# Patient Record
Sex: Male | Born: 1995 | Race: White | Hispanic: No | Marital: Single | Smoking: Never smoker
Health system: Southern US, Community
[De-identification: ages and names within clinical notes are randomized; demographics above are authoritative.]

## PROBLEM LIST (undated history)

## (undated) DIAGNOSIS — G40909 Epilepsy, unspecified, not intractable, without status epilepticus: Secondary | ICD-10-CM

## (undated) DIAGNOSIS — K5190 Ulcerative colitis, unspecified, without complications: Secondary | ICD-10-CM

## (undated) DIAGNOSIS — M419 Scoliosis, unspecified: Secondary | ICD-10-CM

---

## 2018-05-12 ENCOUNTER — Other Ambulatory Visit: Payer: Self-pay

## 2018-05-12 ENCOUNTER — Encounter: Payer: Self-pay | Admitting: Emergency Medicine

## 2018-05-12 DIAGNOSIS — K5190 Ulcerative colitis, unspecified, without complications: Secondary | ICD-10-CM | POA: Insufficient documentation

## 2018-05-12 DIAGNOSIS — R1013 Epigastric pain: Secondary | ICD-10-CM | POA: Diagnosis present

## 2018-05-12 LAB — CBC
HCT: 45.7 % (ref 39.0–52.0)
Hemoglobin: 15.9 g/dL (ref 13.0–17.0)
MCH: 30.1 pg (ref 26.0–34.0)
MCHC: 34.8 g/dL (ref 30.0–36.0)
MCV: 86.6 fL (ref 80.0–100.0)
Platelets: 260 10*3/uL (ref 150–400)
RBC: 5.28 MIL/uL (ref 4.22–5.81)
RDW: 12.2 % (ref 11.5–15.5)
WBC: 7.7 10*3/uL (ref 4.0–10.5)
nRBC: 0 % (ref 0.0–0.2)

## 2018-05-12 LAB — COMPREHENSIVE METABOLIC PANEL
ALT: 29 U/L (ref 0–44)
AST: 31 U/L (ref 15–41)
Albumin: 5.2 g/dL — ABNORMAL HIGH (ref 3.5–5.0)
Alkaline Phosphatase: 47 U/L (ref 38–126)
Anion gap: 7 (ref 5–15)
BUN: 21 mg/dL — ABNORMAL HIGH (ref 6–20)
CHLORIDE: 105 mmol/L (ref 98–111)
CO2: 28 mmol/L (ref 22–32)
Calcium: 9.3 mg/dL (ref 8.9–10.3)
Creatinine, Ser: 0.86 mg/dL (ref 0.61–1.24)
GFR calc Af Amer: >60 mL/min (ref >60)
Glucose, Bld: 94 mg/dL (ref 70–99)
Potassium: 3.7 mmol/L (ref 3.5–5.1)
Sodium: 140 mmol/L (ref 135–145)
Total Bilirubin: 0.7 mg/dL (ref 0.3–1.2)
Total Protein: 7.5 g/dL (ref 6.5–8.1)

## 2018-05-12 LAB — URINALYSIS, COMPLETE (UACMP) WITH MICROSCOPIC
Bacteria, UA: NONE SEEN
Bilirubin Urine: NEGATIVE
Glucose, UA: NEGATIVE mg/dL
Hgb urine dipstick: NEGATIVE
Ketones, ur: NEGATIVE mg/dL
Leukocytes, UA: NEGATIVE
Nitrite: NEGATIVE
PH: 7 (ref 5.0–8.0)
Protein, ur: NEGATIVE mg/dL
Specific Gravity, Urine: 1.019 (ref 1.005–1.030)
Squamous Epithelial / HPF: NONE SEEN (ref 0–5)

## 2018-05-12 LAB — LIPASE, BLOOD: LIPASE: 35 U/L (ref 11–51)

## 2018-05-12 NOTE — ED Triage Notes (Signed)
Pt arrives ambulatory to triage with c/o upper abdominal pain. X 2 days. Pt reports that he was given Meloxicam 7.5 mg for back pain a few days ago and that abdominal pain was listed as one of the side effects. Pt is in NAD.

## 2018-05-13 ENCOUNTER — Emergency Department: Payer: 59

## 2018-05-13 ENCOUNTER — Emergency Department
Admission: EM | Admit: 2018-05-13 | Discharge: 2018-05-13 | Disposition: A | Discharge status: Home / Self Care | Payer: 59 | Attending: Emergency Medicine | Admitting: Emergency Medicine

## 2018-05-13 DIAGNOSIS — R1013 Epigastric pain: Secondary | ICD-10-CM

## 2018-05-13 DIAGNOSIS — K5190 Ulcerative colitis, unspecified, without complications: Secondary | ICD-10-CM

## 2018-05-13 HISTORY — DX: Ulcerative colitis, unspecified, without complications: K51.90

## 2018-05-13 HISTORY — DX: Epilepsy, unspecified, not intractable, without status epilepticus: G40.909

## 2018-05-13 HISTORY — DX: Scoliosis, unspecified: M41.9

## 2018-05-13 MED ORDER — HYDROCODONE-ACETAMINOPHEN 5-325 MG PO TABS
2.0000 | ORAL_TABLET | Freq: Once | ORAL | Status: AC
  Administered 2018-05-13: 2 via ORAL

## 2018-05-13 MED ORDER — HYDROCODONE-ACETAMINOPHEN 5-325 MG PO TABS
ORAL_TABLET | ORAL | Status: AC
  Filled 2018-05-13: qty 1

## 2018-05-13 MED ORDER — ALUM & MAG HYDROXIDE-SIMETH 200-200-20 MG/5ML PO SUSP
30.0000 mL | Freq: Once | ORAL | Status: AC
  Administered 2018-05-13: 30 mL via ORAL
  Filled 2018-05-13: qty 30

## 2018-05-13 MED ORDER — FAMOTIDINE 20 MG PO TABS: 20.0000 mg | ORAL_TABLET | Freq: Two times a day (BID) | ORAL | 0 refills | Status: AC

## 2018-05-13 MED ORDER — HYDROCODONE-ACETAMINOPHEN 5-325 MG PO TABS: 1.0000 | ORAL_TABLET | Freq: Four times a day (QID) | ORAL | 0 refills | Status: AC | PRN

## 2018-05-13 MED ORDER — SUCRALFATE 1 G PO TABS
1.0000 g | ORAL_TABLET | Freq: Once | ORAL | Status: AC
  Administered 2018-05-13: 1 g via ORAL
  Filled 2018-05-13: qty 1

## 2018-05-13 MED ORDER — MISOPROSTOL 200 MCG PO TABS
200.0000 ug | ORAL_TABLET | Freq: Once | ORAL | Status: AC
  Administered 2018-05-13: 200 ug via ORAL
  Filled 2018-05-13: qty 1

## 2018-05-13 NOTE — ED Notes (Signed)
Family to stat desk asking about wait time. Family given update and family verbalizes understanding.

## 2018-05-13 NOTE — Discharge Instructions (Addendum)
Fortunately today your blood work and your ultrasound were very reassuring.  Please begin taking your antacids twice a day as prescribed and make and appointment to follow up with the gastroenterologist this week for a recheck.  Return to the ED sooner for any concerns.  It was a pleasure to take care of you today, and thank you for coming to our emergency department.  If you have any questions or concerns before leaving please ask the nurse to grab me and I'm more than happy to go through your aftercare instructions again.  If you were prescribed any opioid pain medication today such as Norco, Vicodin, Percocet, morphine, hydrocodone, or oxycodone please make sure you do not drive when you are taking this medication as it can alter your ability to drive safely.  If you have any concerns once you are home that you are not improving or are in fact getting worse before you can make it to your follow-up appointment, please do not hesitate to call 911 and come back for further evaluation.  Darel Hong, MD  Results for orders placed or performed during the hospital encounter of 05/13/18  Lipase, blood  Result Value Ref Range   Lipase 35 11 - 51 U/L  Comprehensive metabolic panel  Result Value Ref Range   Sodium 140 135 - 145 mmol/L   Potassium 3.7 3.5 - 5.1 mmol/L   Chloride 105 98 - 111 mmol/L   CO2 28 22 - 32 mmol/L   Glucose, Bld 94 70 - 99 mg/dL   BUN 21 (H) 6 - 20 mg/dL   Creatinine, Ser 0.86 0.61 - 1.24 mg/dL   Calcium 9.3 8.9 - 10.3 mg/dL   Total Protein 7.5 6.5 - 8.1 g/dL   Albumin 5.2 (H) 3.5 - 5.0 g/dL   AST 31 15 - 41 U/L   ALT 29 0 - 44 U/L   Alkaline Phosphatase 47 38 - 126 U/L   Total Bilirubin 0.7 0.3 - 1.2 mg/dL   GFR calc non Af Amer >60 >60 mL/min   GFR calc Af Amer >60 >60 mL/min   Anion gap 7 5 - 15  CBC  Result Value Ref Range   WBC 7.7 4.0 - 10.5 K/uL   RBC 5.28 4.22 - 5.81 MIL/uL   Hemoglobin 15.9 13.0 - 17.0 g/dL   HCT 45.7 39.0 - 52.0 %   MCV 86.6 80.0 -  100.0 fL   MCH 30.1 26.0 - 34.0 pg   MCHC 34.8 30.0 - 36.0 g/dL   RDW 12.2 11.5 - 15.5 %   Platelets 260 150 - 400 K/uL   nRBC 0.0 0.0 - 0.2 %  Urinalysis, Complete w Microscopic  Result Value Ref Range   Color, Urine YELLOW (A) YELLOW   APPearance CLEAR (A) CLEAR   Specific Gravity, Urine 1.019 1.005 - 1.030   pH 7.0 5.0 - 8.0   Glucose, UA NEGATIVE NEGATIVE mg/dL   Hgb urine dipstick NEGATIVE NEGATIVE   Bilirubin Urine NEGATIVE NEGATIVE   Ketones, ur NEGATIVE NEGATIVE mg/dL   Protein, ur NEGATIVE NEGATIVE mg/dL   Nitrite NEGATIVE NEGATIVE   Leukocytes, UA NEGATIVE NEGATIVE   RBC / HPF 0-5 0 - 5 RBC/hpf   WBC, UA 0-5 0 - 5 WBC/hpf   Bacteria, UA NONE SEEN NONE SEEN   Squamous Epithelial / LPF NONE SEEN 0 - 5   US Abdomen Limited Ruq  Result Date: 05/13/2018 CLINICAL DATA:  Initial evaluation for acute epigastric pain. EXAM: ULTRASOUND ABDOMEN LIMITED  RIGHT UPPER QUADRANT COMPARISON:  None. FINDINGS: Gallbladder: No gallstones or wall thickening visualized. No sonographic Murphy sign noted by sonographer. Common bile duct: Diameter: 2 mm Liver: No focal lesion identified. Within normal limits in parenchymal echogenicity. Portal vein is patent on color Doppler imaging with normal direction of blood flow towards the liver. IMPRESSION: Normal right upper quadrant ultrasound. Electronically Signed   By: Jeannine Boga M.D.   On: 05/13/2018 02:44

## 2018-05-13 NOTE — ED Provider Notes (Signed)
[facility] Emergency Department Provider Note  ____________________________________________   First MD Initiated Contact with Patient 05/13/18 [phone]     (approximate)  I have reviewed the triage vital signs and the nursing notes.   HISTORY  Chief Complaint Abdominal Pain   HPI Aaron Rogers is a 22 y.o. male who self presents to the emergency department with roughly 2 days of epigastric pain.  The pain is epigastric burning and constant.  Not postprandial.  Some nausea.  No fevers or chills.  No diarrhea.  He recently began taking meloxicam twice a day for "arthritis" which concerned him because he said he looked up that it can cause issues with your stomach.  He has no history of abdominal surgeries.  He does have a remote history of ulcerative colitis although has not taken any medications in more than 2 years nor has he had any follow-up.  Denies fevers or chills.  Symptoms came on insidiously were moderate to severe and nothing seems to make them better or worse.  They are non-positional.    Past Medical History:  Diagnosis Date  . Epilepsy ([place])   . Scoliosis   . Ulcerative colitis ([place])     There are no active problems to display for this patient.   History reviewed. No pertinent surgical history.  Prior to Admission medications   Medication Sig Start Date End Date Taking? Authorizing Provider  famotidine (PEPCID) 20 MG tablet Take 1 tablet (20 mg total) by mouth 2 (two) times daily. 05/13/18 05/13/19  Darel Hong, MD  HYDROcodone-acetaminophen (NORCO) 5-325 MG tablet Take 1 tablet by mouth every 6 (six) hours as needed for up to 7 doses for severe pain. 05/13/18   Darel Hong, MD    Allergies Patient has no known allergies.  No family history on file.  Social History Social History   Tobacco Use  . Smoking status: Never Smoker  . Smokeless tobacco: Never Used  Substance Use Topics  . Alcohol use: Yes    Comment: Rarely  . Drug  use: Never    Review of Systems Constitutional: No fever/chills Eyes: No visual changes. ENT: No sore throat. Cardiovascular: Denies chest pain. Respiratory: Denies shortness of breath. Gastrointestinal: Positive for abdominal pain.  Positive for nausea, no vomiting.  No diarrhea.  No constipation. Genitourinary: Negative for dysuria. Musculoskeletal: Negative for back pain. Skin: Negative for rash. Neurological: Negative for headaches, focal weakness or numbness.   ____________________________________________   PHYSICAL EXAM:  VITAL SIGNS: ED Triage Vitals  Enc Vitals Group     BP 05/12/18 2330 138/82     Pulse Rate 05/12/18 2330 79     Resp 05/12/18 2330 18     Temp 05/12/18 2330 98.5 F (36.9 C)     Temp Source 05/12/18 2330 Oral     SpO2 05/12/18 2330 99 %     Weight 05/12/18 2331 145 lb (65.8 kg)     Height 05/12/18 2331 5' 6"  (1.676 m)     Head Circumference --      Peak Flow --      Pain Score 05/12/18 2330 7     Pain Loc --      Pain Edu? --      Excl. in [place]? --     Constitutional: Alert and oriented x4 somewhat anxious appearing but nontoxic no diaphoresis speaks full clear sentences Eyes: PERRL EOMI. Head: Atraumatic. Nose: No congestion/rhinnorhea. Mouth/Throat: No trismus Neck: No stridor.   Cardiovascular: Normal rate, regular  rhythm. Grossly normal heart sounds.  Good peripheral circulation. Respiratory: Normal respiratory effort.  No retractions. Lungs CTAB and moving good air Gastrointestinal: Soft abdomen mild upper abdominal tenderness with no rebound or guarding no peritonitis negative Murphy's Musculoskeletal: No lower extremity edema   Neurologic:  Normal speech and language. No gross focal neurologic deficits are appreciated. Skin:  Skin is warm, dry and intact. No rash noted. Psychiatric: Mood and affect are normal. Speech and behavior are normal.    ____________________________________________   DIFFERENTIAL includes but not limited  to  Biliary colic, cholecystitis, GI bleed, ulcerative colitis ____________________________________________   LABS (all labs ordered are listed, but only abnormal results are displayed)  Labs Reviewed  COMPREHENSIVE METABOLIC PANEL - Abnormal; Notable for the following components:      Result Value   BUN 21 (*)    Albumin 5.2 (*)    All other components within normal limits  URINALYSIS, COMPLETE (UACMP) WITH MICROSCOPIC - Abnormal; Notable for the following components:   Color, Urine YELLOW (*)    APPearance CLEAR (*)    All other components within normal limits  LIPASE, BLOOD  CBC    Lab work reviewed by me with no acute disease noted __________________________________________  EKG  ED ECG REPORT I, Darel Hong, the attending physician, personally viewed and interpreted this ECG.  Date: 05/12/2018 EKG Time:  Rate: 79 Rhythm: normal sinus rhythm QRS Axis: Rightward axis Intervals: normal ST/T Wave abnormalities: normal Narrative Interpretation: no evidence of acute ischemia  ____________________________________________  RADIOLOGY  Upper quadrant ultrasound reviewed by me with no acute disease ____________________________________________   PROCEDURES  Procedure(s) performed: o  Procedures  Critical Care performed: no  ____________________________________________   INITIAL IMPRESSION / ASSESSMENT AND PLAN / ED COURSE  Pertinent labs & imaging results that were available during my care of the patient were reviewed by me and considered in my medical decision making (see chart for details).   As part of my medical decision making, I reviewed the following data within the [place] History obtained from family if available, nursing notes, old chart and ekg, as well as notes from prior ED visits.  She comes to the emergency department with several days of epigastric pain.  Lab work is reassuring but epigastric pain very well could  represent biliary disease so ultrasound is pending.  I will also treat him with Maalox, Cytotec, and Carafate in the meantime.  I appreciate his S1Q3T3 pattern however he has no chest pain or shortness of breath and no suggestion of pulmonary embolism.  This is likely a normal variant in a young man.  The patient's pain is only minimally improved after the GI cocktail.  Ultrasound is fortunately reassuring.  We explained the diagnostic uncertainty to the patient and the importance of reestablishing care with GI as it is very important that he resume treatment.  Strict return precautions have been given.      ____________________________________________   FINAL CLINICAL IMPRESSION(S) / ED DIAGNOSES  Final diagnoses:  Epigastric pain  Ulcerative colitis without complications, unspecified location [facility])      NEW MEDICATIONS STARTED DURING THIS VISIT:  Discharge Medication List as of 05/13/2018  2:56 AM    START taking these medications   Details  famotidine (PEPCID) 20 MG tablet Take 1 tablet (20 mg total) by mouth 2 (two) times daily., Starting Sun 05/13/2018, Until Mon 05/13/2019, Print         Note:  This document was prepared using Dragon voice  recognition software and may include unintentional dictation errors.     Darel Hong, MD 05/16/18 [phone]

## 2018-05-13 NOTE — ED Notes (Signed)
Pt states that pain started yesterday and gotten worse today. Mid abdominal pain. Nausea with no vomiting. Pt alert and oriented x 4. Family at bedside.

## 2018-06-11 ENCOUNTER — Other Ambulatory Visit
Admission: RE | Admit: 2018-06-11 | Discharge: 2018-06-11 | Disposition: A | Discharge status: Home / Self Care | Payer: 59 | Source: Ambulatory Visit | Attending: Gastroenterology | Admitting: Gastroenterology

## 2018-06-11 DIAGNOSIS — K51919 Ulcerative colitis, unspecified with unspecified complications: Secondary | ICD-10-CM | POA: Insufficient documentation

## 2018-06-11 LAB — C DIFFICILE QUICK SCREEN W PCR REFLEX
C DIFFICILE (CDIFF) INTERP: NOT DETECTED
C Diff antigen: NEGATIVE
C Diff toxin: NEGATIVE

## 2018-06-14 LAB — CALPROTECTIN, FECAL: Calprotectin, Fecal: 82 ug/g (ref 0–120)

## 2020-07-27 IMAGING — US US ABDOMEN LIMITED
1 series · 14 of 25 positions shown · non-contrast
Comparison: None.

CLINICAL DATA: Initial evaluation for acute epigastric pain.

EXAM:
ULTRASOUND ABDOMEN LIMITED RIGHT UPPER QUADRANT

[Series 1: us abdomen limited · 14 of 60 slices shown]
[im 1/60]
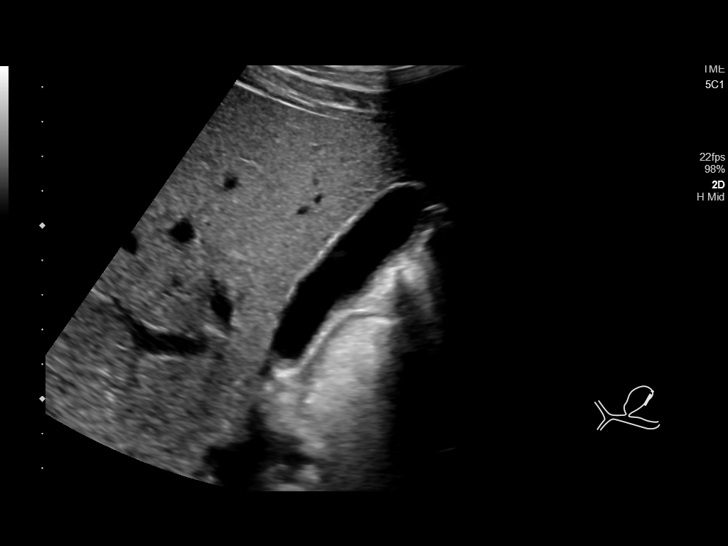
[im 5/60]
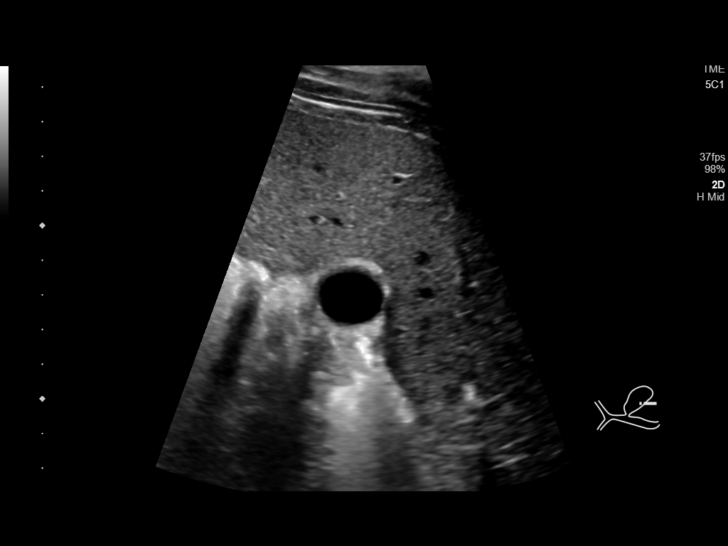
[im 10/60]
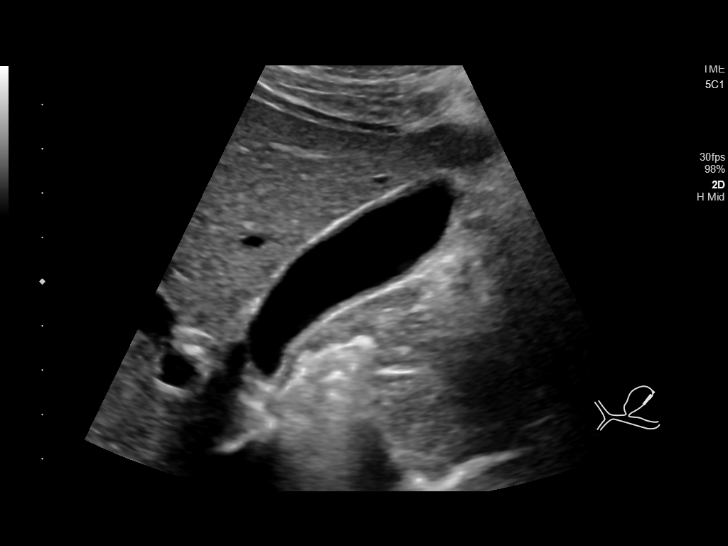
[im 15/60]
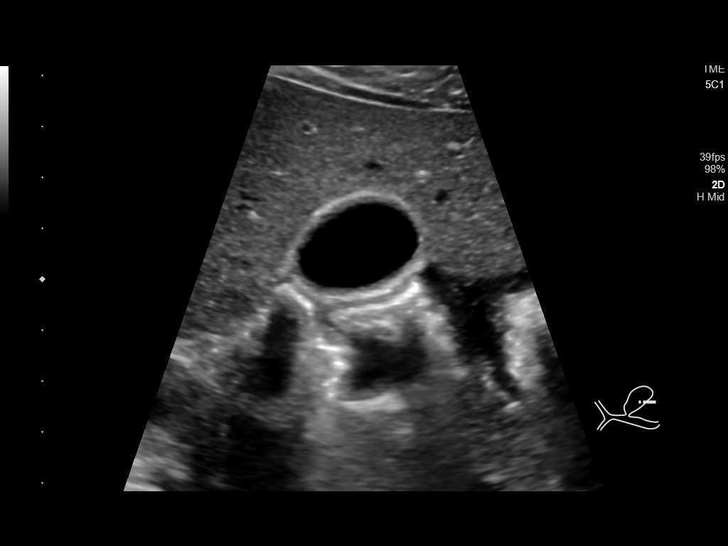
[im 20/60]
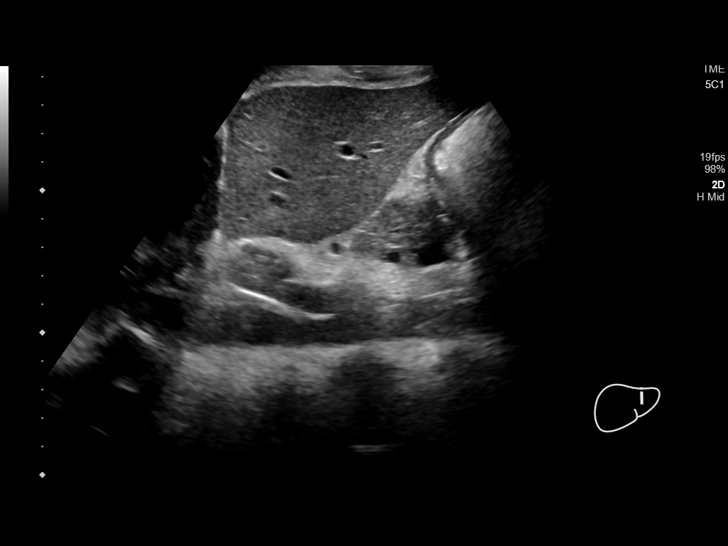
[im 23/60]
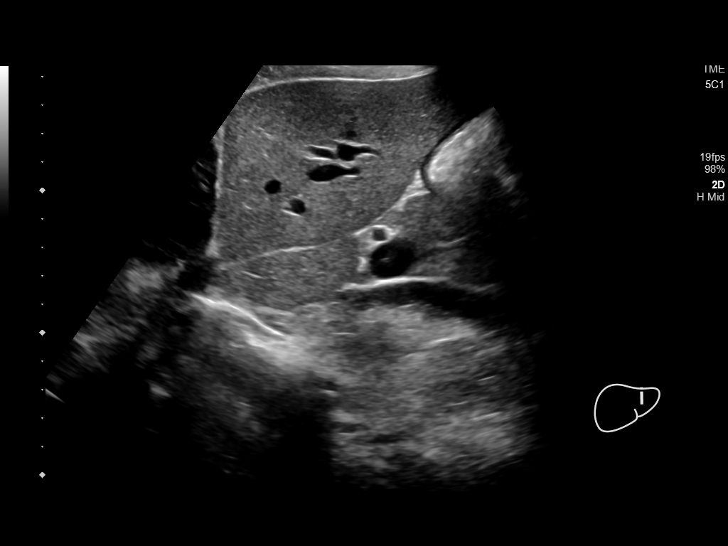
[im 28/60]
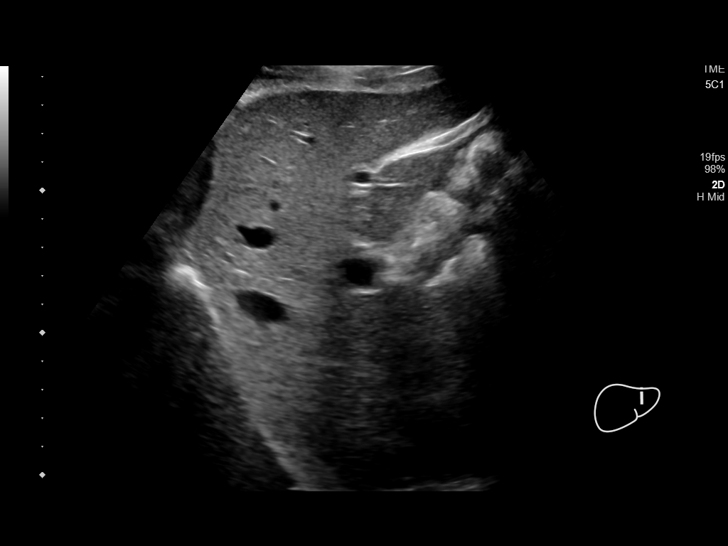
[im 32/60]
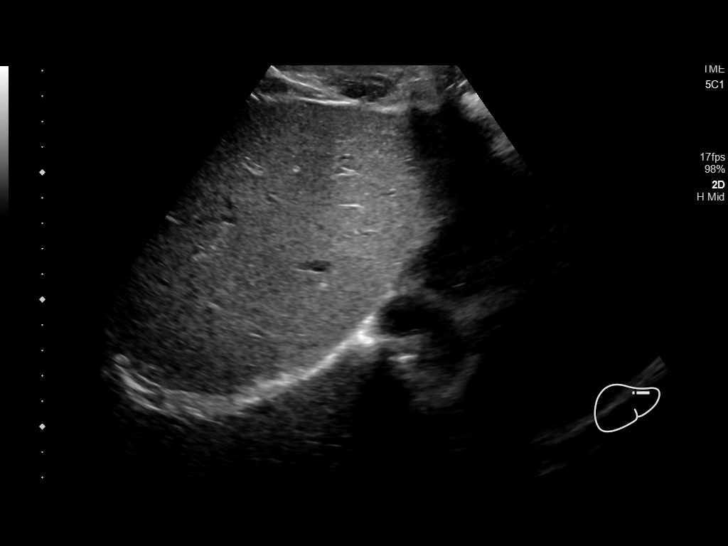
[im 37/60]
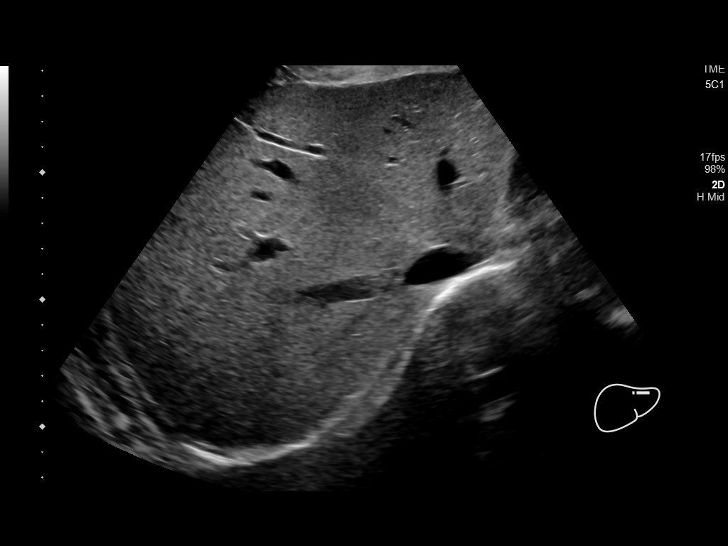
[im 40/60]
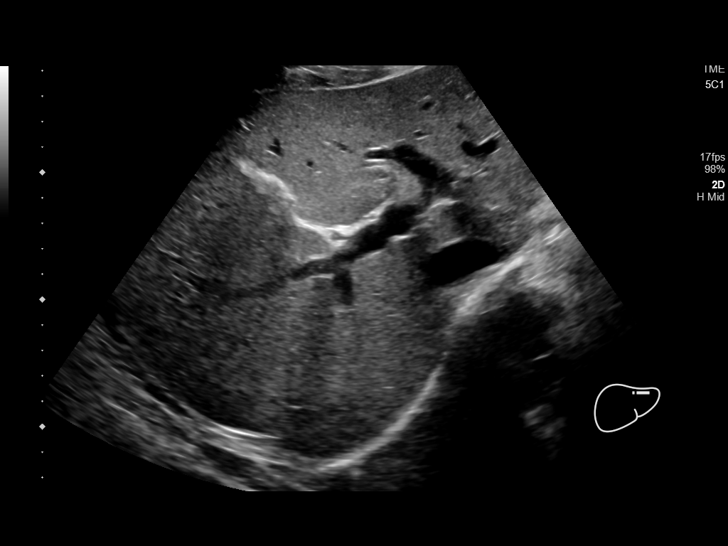
[im 45/60]
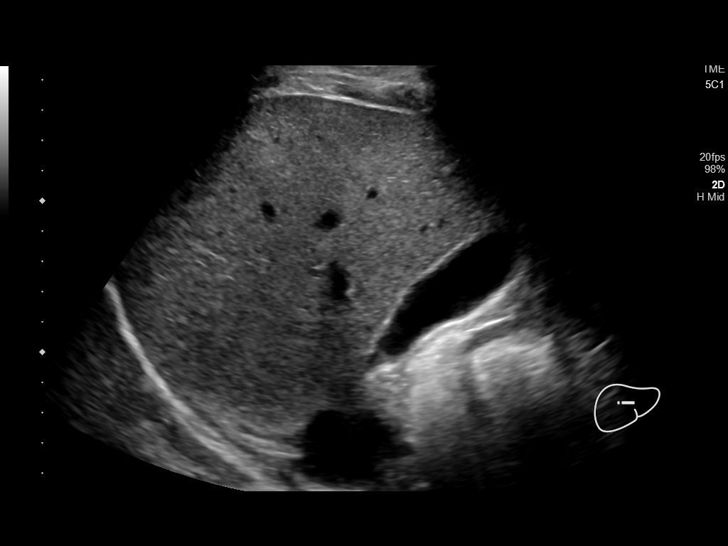
[im 50/60]
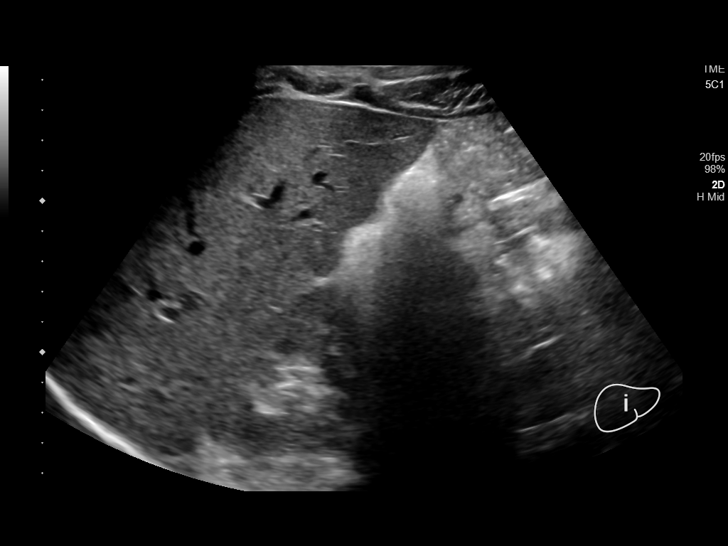
[im 55/60]
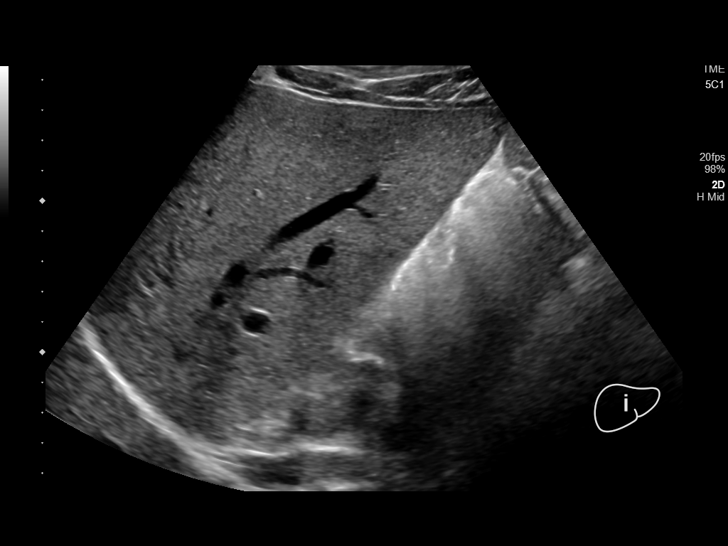
[im 60/60]
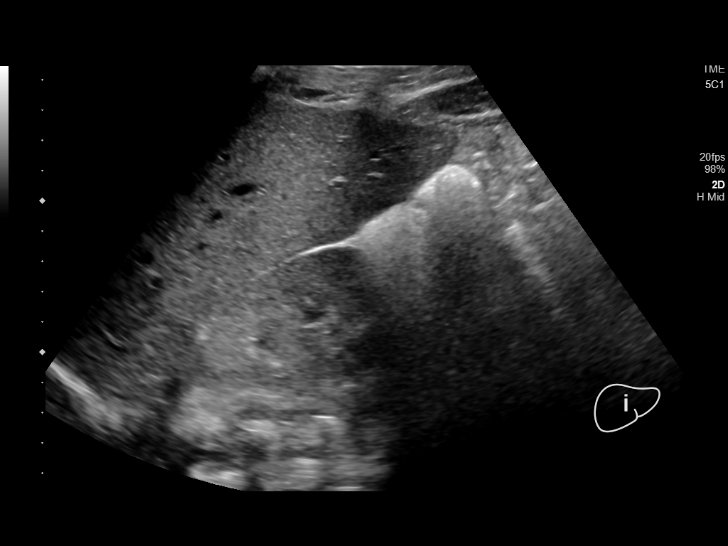

[14 of 25 positions shown; findings below may reference images not displayed]

FINDINGS: Gallbladder:

No gallstones or wall thickening visualized. No sonographic Murphy
sign noted by sonographer.

Common bile duct:

Diameter: 2 mm

Liver:

No focal lesion identified. Within normal limits in parenchymal
echogenicity. Portal vein is patent on color Doppler imaging with
normal direction of blood flow towards the liver.
IMPRESSION: Normal right upper quadrant ultrasound.
